# Patient Record
Sex: Female | Born: 2005 | Race: White | Hispanic: No | Marital: Single | Smoking: Former smoker
Health system: Southern US, Community
[De-identification: ages and names within clinical notes are randomized; demographics above are authoritative.]

## PROBLEM LIST (undated history)

## (undated) DIAGNOSIS — N83209 Unspecified ovarian cyst, unspecified side: Secondary | ICD-10-CM

## (undated) HISTORY — PX: OTHER SURGICAL HISTORY: SHX169

---
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
## 2024-02-19 ENCOUNTER — Other Ambulatory Visit: Payer: Self-pay

## 2024-02-19 ENCOUNTER — Emergency Department
Admission: EM | Admit: 2024-02-19 | Discharge: 2024-02-19 | Disposition: A | Discharge status: Home / Self Care | Payer: MEDICAID | Attending: Emergency Medicine | Admitting: Emergency Medicine

## 2024-02-19 ENCOUNTER — Emergency Department: Payer: MEDICAID

## 2024-02-19 ENCOUNTER — Encounter: Payer: Self-pay | Admitting: *Deleted

## 2024-02-19 DIAGNOSIS — R062 Wheezing: Secondary | ICD-10-CM | POA: Insufficient documentation

## 2024-02-19 DIAGNOSIS — R0789 Other chest pain: Secondary | ICD-10-CM | POA: Diagnosis present

## 2024-02-19 LAB — CBC
HCT: 37.3 % (ref 36.0–46.0)
Hemoglobin: 12.5 g/dL (ref 12.0–15.0)
MCH: 29.6 pg (ref 26.0–34.0)
MCHC: 33.5 g/dL (ref 30.0–36.0)
MCV: 88.4 fL (ref 80.0–100.0)
Platelets: 347 K/uL (ref 150–400)
RBC: 4.22 MIL/uL (ref 3.87–5.11)
RDW: 12.8 % (ref 11.5–15.5)
WBC: 7 K/uL (ref 4.0–10.5)
nRBC: 0 % (ref 0.0–0.2)

## 2024-02-19 LAB — BASIC METABOLIC PANEL WITH GFR
Anion gap: 14 (ref 5–15)
BUN: 11 mg/dL (ref 6–20)
CO2: 24 mmol/L (ref 22–32)
Calcium: 9.7 mg/dL (ref 8.9–10.3)
Chloride: 100 mmol/L (ref 98–111)
Creatinine, Ser: 0.87 mg/dL (ref 0.44–1.00)
GFR, Estimated: >60 mL/min (ref >60)
Glucose, Bld: 105 mg/dL — ABNORMAL HIGH (ref 70–99)
Potassium: 3.4 mmol/L — ABNORMAL LOW (ref 3.5–5.1)
Sodium: 138 mmol/L (ref 135–145)

## 2024-02-19 LAB — TROPONIN T, HIGH SENSITIVITY: Troponin T High Sensitivity: <15 ng/L (ref 0–19)

## 2024-02-19 MED ORDER — PREDNISONE 10 MG PO TABS: 10.0000 mg | ORAL_TABLET | Freq: Every day | ORAL | 0 refills | Status: AC

## 2024-02-19 MED ORDER — METHYLPREDNISOLONE SODIUM SUCC 125 MG IJ SOLR
125.0000 mg | Freq: Once | INTRAMUSCULAR | Status: AC
Start: 2024-02-19 — End: 2024-02-19
  Administered 2024-02-19: 125 mg via INTRAMUSCULAR
  Filled 2024-02-19: qty 2

## 2024-02-19 MED ORDER — IPRATROPIUM-ALBUTEROL 0.5-2.5 (3) MG/3ML IN SOLN
3.0000 mL | Freq: Once | RESPIRATORY_TRACT | Status: AC
  Administered 2024-02-19: 3 mL via RESPIRATORY_TRACT
  Filled 2024-02-19: qty 3

## 2024-02-19 MED ORDER — ALBUTEROL SULFATE HFA 108 (90 BASE) MCG/ACT IN AERS
2.0000 | INHALATION_SPRAY | Freq: Four times a day (QID) | RESPIRATORY_TRACT | 2 refills | Status: AC | PRN
Start: 2024-02-19 — End: ?

## 2024-02-19 NOTE — Discharge Instructions (Signed)
 Please use albuterol inhaler as prescribed.  Take prednisone  as prescribed.  Return to the ER for any shortness of breath, tightness, worsening symptoms or any urgent changes in health.

## 2024-02-19 NOTE — ED Triage Notes (Signed)
 Pt ambulatory to triage.  Pt has right side chest pain and right upper back pain.  Sx for 2-3 days.  Pt reports vaping/smoking since 18 yo.  Pt alert  speech clear.

## 2024-02-19 NOTE — ED Provider Notes (Signed)
 [place] EMERGENCY DEPARTMENT AT [facility] Provider Note   CSN: [id number] Arrival date & time: 02/19/24  8062     Patient presents with: Chest Pain   Denise Hurley is a 18 y.o. female.  Presents to the emergency department valuation of chest pain.  She describes tightness along her chest and back and she feels as if she has a hard time taking a deep breath.  She has a history of asthma but does not have albuterol.  She smokes/vapes frequently.  She denies any productive cough, fevers, chills.  She has not take any medications for her symptoms.  No history of blood clots.      Prior to Admission medications   Medication Sig Start Date End Date Taking? Authorizing Provider  albuterol (VENTOLIN HFA) 108 (90 Base) MCG/ACT inhaler Inhale 2 puffs into the lungs every 6 (six) hours as needed for wheezing or shortness of breath. 02/19/24  Yes Charlene Debby BROCKS, PA-C  metroNIDAZOLE  (FLAGYL ) 500 MG tablet Take 1 tablet (500 mg total) by mouth 2 (two) times daily. 04/19/21   Connell Davies, MD  predniSONE  (DELTASONE ) 10 MG tablet Take 1 tablet (10 mg total) by mouth daily. 6,5,4,3,2,1 six day taper 02/19/24  Yes Karlissa Aron C, PA-C  metroNIDAZOLE  (FLAGYL ) 500 MG tablet Take 1 tablet (500 mg total) by mouth 2 (two) times daily. 01/12/22   Connell Davies, MD    Allergies: Watermelon flavoring agent (non-screening)    Review of Systems  Updated Vital Signs BP 120/81 (BP Location: Left Arm)   Pulse 83   Temp 98 F (36.7 C) (Oral)   Resp 18   Ht 5' 7 (1.702 m)   Wt 56.7 kg   LMP 02/12/2024 (Approximate)   SpO2 100%   BMI 19.58 kg/m   Physical Exam Constitutional:      Appearance: She is well-developed.  HENT:     Head: Normocephalic and atraumatic.     Mouth/Throat:     Pharynx: Oropharynx is clear.  Eyes:     Conjunctiva/sclera: Conjunctivae normal.  Cardiovascular:     Rate and Rhythm: Normal rate and regular rhythm.     Pulses: Normal pulses.  Pulmonary:      Effort: Pulmonary effort is normal. No respiratory distress.     Breath sounds: No stridor. Wheezing present. No rhonchi or rales.  Chest:     Chest wall: No tenderness.  Musculoskeletal:        General: Normal range of motion.     Cervical back: Normal range of motion.  Skin:    General: Skin is warm.     Findings: No rash.  Neurological:     General: No focal deficit present.     Mental Status: She is alert and oriented to person, place, and time.  Psychiatric:        Behavior: Behavior normal.        Thought Content: Thought content normal.     (all labs ordered are listed, but only abnormal results are displayed) Labs Reviewed  BASIC METABOLIC PANEL WITH GFR - Abnormal; Notable for the following components:      Result Value   Potassium 3.4 (*)    Glucose, Bld 105 (*)    All other components within normal limits  CBC  POC URINE PREG, ED  TROPONIN T, HIGH SENSITIVITY  TROPONIN T, HIGH SENSITIVITY    EKG: None  Radiology: DG Chest 2 View Result Date: 02/19/2024 EXAM: 2 VIEW(S) XRAY OF THE CHEST 02/19/2024 08:05:00  PM COMPARISON: None available. CLINICAL HISTORY: chest pain FINDINGS: LUNGS AND PLEURA: No focal pulmonary opacity. No pleural effusion. No pneumothorax. HEART AND MEDIASTINUM: No acute abnormality of the cardiac and mediastinal silhouettes. BONES AND SOFT TISSUES: No acute osseous abnormality. IMPRESSION: 1. No acute cardiopulmonary process. Electronically signed by: Morgane Naveau MD 02/19/2024 09:00 PM EST RP Workstation: [id number]     Procedures   Medications Ordered in the ED  ipratropium-albuterol (DUONEB) 0.5-2.5 (3) MG/3ML nebulizer solution 3 mL (3 mLs Nebulization Given 02/19/24 2206)  methylPREDNISolone sodium succinate (SOLU-MEDROL) 125 mg/2 mL injection 125 mg (125 mg Intramuscular Given 02/19/24 2203)                                    Medical Decision Making Amount and/or Complexity of Data Reviewed Labs: ordered. Radiology:  ordered.  Risk Prescription drug management.   19 year old female with history of asthma presents with wheezing, chest tightness.  She was given Solu-Medrol and 1 DuoNeb treatment and had complete resolution of tightness with improved breath sounds bilaterally.  Originally her vital signs were stable with normal O2 sats and heart rate.  Initially she had some subtle wheezing and decreased air movement but after 1 DuoNeb treatment she had full air movement bilaterally with no wheezing rales or rhonchi.  Chest x-ray negative for any acute cardiopulmonary process.  She had a negative cardiac workup with EKG, troponin and CBC and BMP.  Patient given albuterol inhaler and prednisone  taper.  We discussed signs and symptoms return to the ER for.  Final diagnoses:  Wheezing    ED Discharge Orders          Ordered    albuterol (VENTOLIN HFA) 108 (90 Base) MCG/ACT inhaler  Every 6 hours PRN        02/19/24 2143    predniSONE  (DELTASONE ) 10 MG tablet  Daily        02/19/24 2143               Jamielynn Wigley C, PA-C 02/19/24 2217    Dorothyann Drivers, MD 02/19/24 2228

## 2024-02-20 LAB — POC URINE PREG, ED: Preg Test, Ur: NEGATIVE

## 2024-03-11 ENCOUNTER — Ambulatory Visit
Admission: EM | Admit: 2024-03-11 | Discharge: 2024-03-11 | Disposition: A | Discharge status: Home / Self Care | Payer: MEDICAID | Attending: Emergency Medicine | Admitting: Emergency Medicine

## 2024-03-11 DIAGNOSIS — Z113 Encounter for screening for infections with a predominantly sexual mode of transmission: Secondary | ICD-10-CM | POA: Diagnosis not present

## 2024-03-11 DIAGNOSIS — N898 Other specified noninflammatory disorders of vagina: Secondary | ICD-10-CM | POA: Diagnosis not present

## 2024-03-11 LAB — POCT URINE PREGNANCY: Preg Test, Ur: NEGATIVE

## 2024-03-11 MED ORDER — METRONIDAZOLE 500 MG PO TABS: 500.0000 mg | ORAL_TABLET | Freq: Two times a day (BID) | ORAL | 0 refills | Status: AC

## 2024-03-11 NOTE — ED Provider Notes (Signed)
 [name]    CSN: [id number] Arrival date & time: 03/11/24  1323      History   Chief Complaint Chief Complaint  Patient presents with   SEXUALLY TRANSMITTED DISEASE    HPI Denise Hurley is a 18 y.o. female.  Patient presents with 2-day history of malodorous vaginal discharge which she states is similar to previous episodes of bacterial vaginitis.  She requests STD testing, including blood work.  She denies pelvic pain, dysuria, hematuria, abdominal pain, rash, lesions.  LMP current.  The history is provided by the patient and medical records.    Past Medical History:  Diagnosis Date   Asthma    Ovarian cyst     There are no active problems to display for this patient.   Past Surgical History:  Procedure Laterality Date   no surgical history      OB History     Gravida  0   Para  0   Term  0   Preterm  0   AB  0   Living  0      SAB  0   IAB  0   Ectopic  0   Multiple  0   Live Births  0            Home Medications    Prior to Admission medications   Medication Sig Start Date End Date Taking? Authorizing Provider  metroNIDAZOLE  (FLAGYL ) 500 MG tablet Take 1 tablet (500 mg total) by mouth 2 (two) times daily. 03/11/24  Yes Corlis Burnard DEL, NP  albuterol  (VENTOLIN  HFA) 108 (90 Base) MCG/ACT inhaler Inhale 2 puffs into the lungs every 6 (six) hours as needed for wheezing or shortness of breath. 02/19/24   Charlene Debby BROCKS, PA-C  predniSONE  (DELTASONE ) 10 MG tablet Take 1 tablet (10 mg total) by mouth daily. 6,5,4,3,2,1 six day taper Patient not taking: Reported on 03/11/2024 02/19/24   Charlene Debby BROCKS, PA-C    Family History Family History  Problem Relation Age of Onset   Healthy Mother    Healthy Maternal Grandmother    Healthy Maternal Grandfather     Social History Social History   Tobacco Use   Smoking status: Former    Types: Cigarettes   Smokeless tobacco: Never  Vaping Use   Vaping status: Every Day   Substance Use Topics   Alcohol use: Not Currently   Drug use: Yes    Types: Marijuana    Comment: xanax     Allergies   Watermelon flavoring agent (non-screening)   Review of Systems Review of Systems  Constitutional:  Negative for chills and fever.  Gastrointestinal:  Negative for abdominal pain.  Genitourinary:  Positive for vaginal discharge. Negative for dysuria, flank pain, hematuria and pelvic pain.     Physical Exam Triage Vital Signs ED Triage Vitals [03/11/24 1415]  Encounter Vitals Group     BP 108/68     Girls Systolic BP Percentile      Girls Diastolic BP Percentile      Boys Systolic BP Percentile      Boys Diastolic BP Percentile      Pulse Rate 81     Resp 18     Temp 98.2 F (36.8 C)     Temp src      SpO2 97 %     Weight      Height      Head Circumference      Peak Flow  Pain Score      Pain Loc      Pain Education      Exclude from Growth Chart    No data found.  Updated Vital Signs BP 108/68   Pulse 81   Temp 98.2 F (36.8 C)   Resp 18   LMP 02/12/2024 (Approximate)   SpO2 97%   Visual Acuity Right Eye Distance:   Left Eye Distance:   Bilateral Distance:    Right Eye Near:   Left Eye Near:    Bilateral Near:     Physical Exam Constitutional:      General: She is not in acute distress. HENT:     Mouth/Throat:     Mouth: Mucous membranes are moist.  Cardiovascular:     Rate and Rhythm: Normal rate.  Pulmonary:     Effort: Pulmonary effort is normal. No respiratory distress.  Abdominal:     General: Bowel sounds are normal.     Palpations: Abdomen is soft.     Tenderness: There is no abdominal tenderness. There is no right CVA tenderness, left CVA tenderness, guarding or rebound.  Genitourinary:    Comments: Patient declines GU exam. Neurological:     Mental Status: She is alert.      UC Treatments / Results  Labs (all labs ordered are listed, but only abnormal results are displayed) Labs Reviewed  POCT  URINE PREGNANCY - Normal  SYPHILIS: RPR W/REFLEX TO RPR TITER AND TREPONEMAL ANTIBODIES, TRADITIONAL SCREENING AND DIAGNOSIS ALGORITHM  HIV ANTIBODY (ROUTINE TESTING W REFLEX)  CERVICOVAGINAL ANCILLARY ONLY    EKG   Radiology No results found.  Procedures Procedures (including critical care time)  Medications Ordered in UC Medications - No data to display  Initial Impression / Assessment and Plan / UC Course  I have reviewed the triage vital signs and the nursing notes.  Pertinent labs & imaging results that were available during my care of the patient were reviewed by me and considered in my medical decision making (see chart for details).    Vaginal discharge, STD screening.  Treating today with metronidazole .  Instructed patient to stop this medication if her test come back negative for BV.  Patient obtained self swab for testing.  Discussed that all test results will be available in MyChart and we will call if test results are positive.  Discussed that she may require change in treatment at that time and that sexual partner(s) may also require treatment.  Instructed patient to abstain from sexual activity until all test results are back and treatment is completed if needed.  Instructed her to follow-up with her PCP if her symptoms are not improving.  Patient agrees to plan of care.   Final Clinical Impressions(s) / UC Diagnoses   Final diagnoses:  Screening for STD (sexually transmitted disease)  Vaginal discharge     Discharge Instructions      Take the metronidazole  as directed.  Stop taking this if your test come back negative for bacterial vaginitis.    Your tests are pending.  Your test results will be available on your MyChart account.  If your test results are positive, we will call you.  Do not have sexual activity until all test results are back and treatment is completed if needed.  Follow-up with your primary care provider if your symptoms are not improving.       ED Prescriptions     Medication Sig Dispense Auth. Provider   metroNIDAZOLE  (FLAGYL ) 500 MG tablet  Take 1 tablet (500 mg total) by mouth 2 (two) times daily. 14 tablet Corlis Burnard DEL, NP      PDMP not reviewed this encounter.   Corlis Burnard DEL, NP 03/11/24 [phone]

## 2024-03-11 NOTE — ED Triage Notes (Signed)
 Patient to Urgent Care with complaints of vaginal discharge with odor.   Symptoms x2-3 days.   Also requests std testing.

## 2024-03-11 NOTE — Discharge Instructions (Addendum)
 Take the metronidazole  as directed.  Stop taking this if your test come back negative for bacterial vaginitis.    Your tests are pending.  Your test results will be available on your MyChart account.  If your test results are positive, we will call you.  Do not have sexual activity until all test results are back and treatment is completed if needed.  Follow-up with your primary care provider if your symptoms are not improving.

## 2024-03-12 LAB — HIV ANTIBODY (ROUTINE TESTING W REFLEX): HIV Screen 4th Generation wRfx: NONREACTIVE

## 2024-03-12 LAB — SYPHILIS: RPR W/REFLEX TO RPR TITER AND TREPONEMAL ANTIBODIES, TRADITIONAL SCREENING AND DIAGNOSIS ALGORITHM: RPR Ser Ql: NONREACTIVE

## 2024-03-13 ENCOUNTER — Ambulatory Visit (HOSPITAL_COMMUNITY): Payer: Self-pay

## 2024-03-13 LAB — CERVICOVAGINAL ANCILLARY ONLY
Bacterial Vaginitis (gardnerella): POSITIVE — AB
Candida Glabrata: NEGATIVE
Candida Vaginitis: NEGATIVE
Chlamydia: NEGATIVE
Comment: NEGATIVE
Comment: NEGATIVE
Comment: NEGATIVE
Comment: NEGATIVE
Comment: NEGATIVE
Comment: NORMAL
Neisseria Gonorrhea: NEGATIVE
Trichomonas: NEGATIVE
